# Patient Record
Sex: Female | Born: 1935 | Race: White | Hispanic: No | Marital: Married | Smoking: Former smoker
Health system: Southern US, Community
[De-identification: ages and names within clinical notes are randomized; demographics above are authoritative.]

## PROBLEM LIST (undated history)

## (undated) DIAGNOSIS — E785 Hyperlipidemia, unspecified: Secondary | ICD-10-CM

## (undated) HISTORY — DX: Hyperlipidemia, unspecified: E78.5

## (undated) HISTORY — PX: ABDOMINAL HYSTERECTOMY: SHX81

## (undated) HISTORY — PX: APPENDECTOMY: SHX54

## (undated) HISTORY — PX: FACIAL COSMETIC SURGERY: SHX629

---

## 1998-09-30 ENCOUNTER — Other Ambulatory Visit: Admission: RE | Admit: 1998-09-30 | Discharge: 1998-09-30 | Payer: Self-pay | Admitting: Obstetrics and Gynecology

## 1998-10-13 ENCOUNTER — Encounter: Admission: RE | Admit: 1998-10-13 | Discharge: 1999-01-11 | Payer: Self-pay | Admitting: Gynecology

## 1999-10-03 ENCOUNTER — Other Ambulatory Visit: Admission: RE | Admit: 1999-10-03 | Discharge: 1999-10-03 | Payer: Self-pay | Admitting: Obstetrics and Gynecology

## 1999-11-11 ENCOUNTER — Emergency Department (HOSPITAL_COMMUNITY): Admission: EM | Admit: 1999-11-11 | Discharge: 1999-11-11 | Payer: Self-pay | Admitting: Emergency Medicine

## 2006-07-17 ENCOUNTER — Encounter: Payer: Self-pay | Admitting: Internal Medicine

## 2007-03-24 ENCOUNTER — Emergency Department (HOSPITAL_COMMUNITY): Admission: EM | Admit: 2007-03-24 | Discharge: 2007-03-24 | Payer: Self-pay | Admitting: Emergency Medicine

## 2007-04-11 ENCOUNTER — Ambulatory Visit: Payer: Self-pay | Admitting: Cardiology

## 2007-04-17 ENCOUNTER — Ambulatory Visit: Payer: Self-pay | Admitting: Internal Medicine

## 2007-04-17 DIAGNOSIS — M899 Disorder of bone, unspecified: Secondary | ICD-10-CM | POA: Insufficient documentation

## 2007-04-17 DIAGNOSIS — T50995A Adverse effect of other drugs, medicaments and biological substances, initial encounter: Secondary | ICD-10-CM

## 2007-04-17 DIAGNOSIS — R030 Elevated blood-pressure reading, without diagnosis of hypertension: Secondary | ICD-10-CM

## 2007-04-17 DIAGNOSIS — M949 Disorder of cartilage, unspecified: Secondary | ICD-10-CM

## 2007-04-17 DIAGNOSIS — I499 Cardiac arrhythmia, unspecified: Secondary | ICD-10-CM | POA: Insufficient documentation

## 2007-04-17 DIAGNOSIS — M129 Arthropathy, unspecified: Secondary | ICD-10-CM | POA: Insufficient documentation

## 2007-04-17 DIAGNOSIS — M1990 Unspecified osteoarthritis, unspecified site: Secondary | ICD-10-CM | POA: Insufficient documentation

## 2007-04-17 DIAGNOSIS — E785 Hyperlipidemia, unspecified: Secondary | ICD-10-CM

## 2007-04-18 ENCOUNTER — Encounter: Payer: Self-pay | Admitting: Internal Medicine

## 2007-04-24 ENCOUNTER — Ambulatory Visit: Payer: Self-pay | Admitting: Cardiology

## 2007-04-24 LAB — CONVERTED CEMR LAB
ALT: 37 units/L — ABNORMAL HIGH (ref 0–35)
AST: 28 units/L (ref 0–37)
Albumin: 3.9 g/dL (ref 3.5–5.2)
BUN: 9 mg/dL (ref 6–23)
CO2: 30 meq/L (ref 19–32)
Chloride: 105 meq/L (ref 96–112)
Cholesterol: 238 mg/dL (ref 0–200)
Creatinine, Ser: 0.8 mg/dL (ref 0.4–1.2)
Direct LDL: 166.8 mg/dL
Total Protein: 7.3 g/dL (ref 6.0–8.3)

## 2007-04-25 ENCOUNTER — Encounter: Payer: Self-pay | Admitting: Cardiology

## 2007-04-25 ENCOUNTER — Encounter: Payer: Self-pay | Admitting: Internal Medicine

## 2007-04-25 ENCOUNTER — Ambulatory Visit: Payer: Self-pay

## 2007-04-30 ENCOUNTER — Ambulatory Visit: Payer: Self-pay | Admitting: Cardiology

## 2007-05-01 ENCOUNTER — Telehealth: Payer: Self-pay | Admitting: Internal Medicine

## 2007-05-03 ENCOUNTER — Encounter: Payer: Self-pay | Admitting: Internal Medicine

## 2007-05-07 ENCOUNTER — Telehealth: Payer: Self-pay | Admitting: *Deleted

## 2007-05-14 ENCOUNTER — Ambulatory Visit: Payer: Self-pay | Admitting: Internal Medicine

## 2007-05-21 ENCOUNTER — Ambulatory Visit: Payer: Self-pay | Admitting: Internal Medicine

## 2007-05-21 DIAGNOSIS — E559 Vitamin D deficiency, unspecified: Secondary | ICD-10-CM | POA: Insufficient documentation

## 2007-05-29 ENCOUNTER — Ambulatory Visit: Payer: Self-pay | Admitting: Internal Medicine

## 2007-05-29 ENCOUNTER — Telehealth: Payer: Self-pay | Admitting: *Deleted

## 2007-06-12 ENCOUNTER — Telehealth: Payer: Self-pay | Admitting: Internal Medicine

## 2007-06-26 ENCOUNTER — Encounter: Payer: Self-pay | Admitting: Internal Medicine

## 2007-06-27 ENCOUNTER — Ambulatory Visit: Payer: Self-pay | Admitting: Family Medicine

## 2007-06-27 DIAGNOSIS — F411 Generalized anxiety disorder: Secondary | ICD-10-CM | POA: Insufficient documentation

## 2007-06-27 DIAGNOSIS — N3000 Acute cystitis without hematuria: Secondary | ICD-10-CM | POA: Insufficient documentation

## 2007-06-27 LAB — CONVERTED CEMR LAB
Glucose, Urine, Semiquant: NEGATIVE
Nitrite: NEGATIVE
Protein, U semiquant: 30

## 2007-07-29 ENCOUNTER — Telehealth: Payer: Self-pay | Admitting: Internal Medicine

## 2007-08-05 ENCOUNTER — Telehealth: Payer: Self-pay | Admitting: Internal Medicine

## 2007-08-15 ENCOUNTER — Ambulatory Visit: Payer: Self-pay | Admitting: Internal Medicine

## 2007-08-15 LAB — CONVERTED CEMR LAB
Glucose, Urine, Semiquant: NEGATIVE
Protein, U semiquant: NEGATIVE
WBC Urine, dipstick: NEGATIVE
pH: 7

## 2007-08-16 ENCOUNTER — Encounter: Payer: Self-pay | Admitting: Internal Medicine

## 2007-08-21 ENCOUNTER — Telehealth: Payer: Self-pay | Admitting: *Deleted

## 2007-09-17 ENCOUNTER — Ambulatory Visit: Payer: Self-pay | Admitting: Internal Medicine

## 2007-09-17 DIAGNOSIS — R109 Unspecified abdominal pain: Secondary | ICD-10-CM

## 2007-09-17 LAB — CONVERTED CEMR LAB
BUN: 12 mg/dL (ref 6–23)
Basophils Relative: 0.8 % (ref 0.0–3.0)
Calcium: 9.4 mg/dL (ref 8.4–10.5)
Creatinine, Ser: 1 mg/dL (ref 0.4–1.2)
Eosinophils Relative: 1.3 % (ref 0.0–5.0)
GFR calc Af Amer: 70 mL/min
Glucose, Bld: 82 mg/dL (ref 70–99)
HCT: 40.4 % (ref 36.0–46.0)
Hemoglobin: 13.9 g/dL (ref 12.0–15.0)
Ketones, urine, test strip: NEGATIVE
Monocytes Absolute: 0.8 10*3/uL (ref 0.1–1.0)
Monocytes Relative: 7.9 % (ref 3.0–12.0)
Neutro Abs: 3.8 10*3/uL (ref 1.4–7.7)
Nitrite: NEGATIVE
RBC: 4.1 M/uL (ref 3.87–5.11)
RDW: 11.8 % (ref 11.5–14.6)
WBC: 9.8 10*3/uL (ref 4.5–10.5)
pH: 7

## 2007-09-18 ENCOUNTER — Encounter: Payer: Self-pay | Admitting: Internal Medicine

## 2007-09-18 ENCOUNTER — Ambulatory Visit: Payer: Self-pay | Admitting: Cardiovascular Disease

## 2007-10-22 ENCOUNTER — Encounter: Payer: Self-pay | Admitting: Internal Medicine

## 2007-10-23 ENCOUNTER — Telehealth: Payer: Self-pay | Admitting: Internal Medicine

## 2008-01-08 ENCOUNTER — Telehealth: Payer: Self-pay | Admitting: Internal Medicine

## 2008-01-13 ENCOUNTER — Encounter: Payer: Self-pay | Admitting: Internal Medicine

## 2008-09-21 ENCOUNTER — Encounter: Payer: Self-pay | Admitting: Internal Medicine

## 2008-09-30 ENCOUNTER — Encounter: Payer: Self-pay | Admitting: Internal Medicine

## 2010-06-07 NOTE — Assessment & Plan Note (Signed)
[facility]                            CARDIOLOGY OFFICE NOTE   NAME:Diana Landry, Diana Landry                         MRN:          [id number]  DATE:04/30/2007                            DOB:          1935-03-26    [name] returns today for followup.  Please see my extensive note  from our initial visit on April 11, 2007.   Her echocardiogram did not show any prolapse.  I did not think she had  this by exam, but was told by history she did.  Her potassium had been  low and we put her on a potassium rich diet and her potassium repeat was  3.9.  She had an exercise rest stress Myoview that showed poor physical  conditioning with an exercise time of 4 minutes.  Her heart rate  increased to 157 which is 104% of predicted maximum heart rate.  There  were no ST-segment changes.  There were no arrhythmias.  Her ejection  fraction is 76% with hyperdynamic LV function.  There was no sign of  ischemia.  Her TSH was normal.  Lipid panel showed a total cholesterol  of 238, LDL of 166.8, VLDL of 23, HDL 48.7, triglycerides of 117.  LFTs  were normal.   She cannot tolerate Crestor because of profound insomnia.  She is has  not trialed Pravachol.   She brought in a list of blood pressures which all are under good  control.  She has a history of labile hypertension.   ASSESSMENT/PLAN:  We spent about 20 plus minutes going over all of her  questions and answers.  I have recommend the following:   1. Pravastatin 20 mg p.o. nightly.  This certainly will not achieve      goal, but certainly will head things in the right direction.      Hopefully, will stabilize any nonobstructive plaque.  She agrees to      try this.  Will check liver and LFTs in 6 weeks.  2. Gradual increase in her exercise program for a target heart rate of      about 120-130 beats per minute.  3. Reassurance about her palpitations.  4. Follow blood pressure as she has done.  5. Reinforce the fact she  does not have prolapse.  6. I will plan on seeing her back in about 8 weeks.     Thomas C. Daleen Squibb, MD, [facility]  Electronically Signed    TCW/MedQ  DD: 04/30/2007  DT: 04/30/2007  Job #: [id number]   cc:   Neta Mends. Fabian Sharp, MD

## 2010-06-07 NOTE — Assessment & Plan Note (Signed)
[facility]                            CARDIOLOGY OFFICE NOTE   NAME:Diana Landry                         MRN:          [id number]  DATE:04/11/2007                            DOB:          Jul 19, 1935    HISTORY OF PRESENT ILLNESS:  I was asked by Dr. Dione Booze of the [facility] emergency department to follow-up and consult on [name]  with chest discomfort.   In the early morning hours of March 24, 2007, she awoke with chest  discomfort that she described as a heaviness 5/10.  It went around her  left side.  She denied any associated diaphoresis, dyspnea, shortness of  breath or nausea.   She has a history of hiatal hernia, but she said this was markedly  different.  She also has a history of mitral valve prolapse and a  history of palpitations and arrhythmias with her prolapse (premature  atrial contractions), diagnosed when she was 75 years old.  A 2-D  echocardiogram apparently done about 7 years ago at [facility] which showed  mild prolapse.   At the emergency room, her EKG was essentially normal except for some ST-  segment flattening inferolaterally.   Her chest x-ray showed a prominent fat pad, but no evidence of  cardiomegaly, edema or pulmonary disease.   Her point care markers were negative.  Her CBC was normal.  Her  electrolytes were normal except for a potassium of 3.4.  Her D-dimer was  normal.   Since discharge, she says her heart has been jumping around lot and she  feels it skipping and racing, particularly at night.   PAST MEDICAL HISTORY:  She does not smoke.  She quit in 1984.  She does  not use any recreational products.  She drinks a glass of wine or two a  week.  She does not use caffeinated beverages.  She does not use dietary  suppressants or over-the-counter decongestants.  She does have a history  of hyperlipidemia.  I do not have any values, but she says it has been  high in the past.  She enjoys yoga,  walking and goes up and down her  stairs which is three stories.   ALLERGIES:  SHE IS INTOLERANT TO PENICILLIN DETROL LA.  SHE HAS NO  HISTORY OF DYE REACTION.   MEDICATIONS:  She is currently on no medications.   SURGICAL HISTORY:  1. Appendectomy 1954.  2. Hysterectomy,.  3. Endometriosis in 1977.  4. Oophorectomy in 1982.  Both ovaries apparently have been removed.   FAMILY HISTORY:  Negative for premature coronary disease.   SOCIAL HISTORY:  She is married and has one child.  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   REVIEW OF SYSTEMS:  All systems tested.  She does have a hiatal hernia  and occasional reflux.  She usually takes Rolaids for this or TUMS.  She  has a history of menstrual dysfunction and chronic arthritis,  particularly in her neck.  She has some osteopenia.  Other review of  systems negative.   PHYSICAL EXAMINATION:  VITAL SIGNS:  Blood pressure 166/92 today.  Her  heart rate is 88 and regular.  She is 5 feet 4, she is emotional and her  weight is 169.  GENERAL:  She looks like she has not slept well in several days.  HEENT:  Normocephalic, atraumatic.  PERRL.  Extraocular is intact.  Sclerae are clear.  Facial symmetry is normal.  Carotid upstrokes equal  bilaterally without bruits, no JVD.  Thyroid is not enlarged.  NECK:  Supple.  Trachea is midline.  LUNGS:  Clear to auscultation and percussion without decreased breath  sounds or rhonchi or wheezes.  HEART:  Her PMI is nondisplaced.  I could hear no definite click.  She  has a very faint systolic murmur at the left sternal border.  There is  no gallop.  ABDOMEN:  Soft, good bowel sounds.  No midline bruit.  No tenderness.  No obvious organomegaly.  EXTREMITIES:  No cyanosis, clubbing or edema.  Pulses intact.  NEUROLOGICAL:  Exam is intact.  SKIN:  Unremarkable.   LABORATORY DATA:  I have reviewed blood pressures taken home and all are  quite good averaging about  125/70.  She did have a couple of systolics  that were high with heart rate of 120.  The majority are under good  control.   ASSESSMENT:  1. Chest discomfort with several cardiac risk factors.  Rule out      obstructive coronary disease.  2. History of palpitations and PACs for a number of years.  She      associates this with mitral valve prolapse.  If she has prolapse,      it is very mild by my exam.  3. Labile blood pressure because the association of tachycardia      disease is probably anxiety related.  However, we need to keep in      the back of our minds any sort of neuroendocrine problem.  I doubt      this is the case.  4. Hypokalemia which could make her palpitations and PACs worse.  We      talked about this at length.  There is no obvious calls of this.      Please note that her sodium was normal, however.   PLAN:  1. A 2-D echocardiogram assess for significant prolapse.  2. Potassium rich diet with repeat potassium next week.  3. Exercise/rest stress Myoview.  4. TSH, comprehensive metabolic panel and lipids.   After I obtain this date, I will sit down and talk to her.  We could  consider low dose beta blockade.  If she does not respond, just  reassurance.  She certainly has a potassium higher than low normal.     Thomas C. Daleen Squibb, MD, [facility]  Electronically Signed    TCW/MedQ  DD: 04/11/2007  DT: 04/12/2007  Job #: [id number]   cc:   Neta Mends. Fabian Sharp, MD  Dione Booze, MD

## 2010-09-30 ENCOUNTER — Inpatient Hospital Stay: Payer: Self-pay | Admitting: Internal Medicine

## 2010-10-17 LAB — DIFFERENTIAL
Basophils Absolute: 0.1
Basophils Relative: 1
Eosinophils Absolute: 0.2
Eosinophils Relative: 2
Lymphocytes Relative: 63 — ABNORMAL HIGH
Lymphs Abs: 7.2 — ABNORMAL HIGH
Monocytes Absolute: 0.9
Monocytes Relative: 8
Neutro Abs: 2.9
Neutrophils Relative %: 26 — ABNORMAL LOW

## 2010-10-17 LAB — POCT CARDIAC MARKERS
CKMB, poc: 1
Myoglobin, poc: 61
Operator id: 222501

## 2010-10-17 LAB — D-DIMER, QUANTITATIVE: D-Dimer, Quant: 0.29

## 2010-10-17 LAB — I-STAT 8, (EC8 V) (CONVERTED LAB)
Acid-Base Excess: 2
BUN: 13
Bicarbonate: 26.4 — ABNORMAL HIGH
Chloride: 104
Glucose, Bld: 114 — ABNORMAL HIGH
HCT: 44
Hemoglobin: 15
Operator id: 222501
Potassium: 3.4 — ABNORMAL LOW
Sodium: 137
TCO2: 28
pCO2, Ven: 38.6 — ABNORMAL LOW
pH, Ven: 7.443 — ABNORMAL HIGH

## 2010-10-17 LAB — CBC
MCHC: 34.1
RDW: 12.6

## 2010-10-17 LAB — PATHOLOGIST SMEAR REVIEW

## 2012-12-11 ENCOUNTER — Ambulatory Visit: Payer: Self-pay | Admitting: Podiatry

## 2014-03-03 ENCOUNTER — Ambulatory Visit: Payer: Self-pay | Admitting: Podiatry

## 2014-12-09 ENCOUNTER — Encounter (HOSPITAL_COMMUNITY): Payer: Self-pay | Admitting: *Deleted

## 2014-12-09 ENCOUNTER — Emergency Department (HOSPITAL_COMMUNITY)
Admission: EM | Admit: 2014-12-09 | Discharge: 2014-12-09 | Disposition: A | Discharge status: Home / Self Care | Payer: Medicare Other | Attending: Emergency Medicine | Admitting: Emergency Medicine

## 2014-12-09 ENCOUNTER — Emergency Department (HOSPITAL_COMMUNITY): Payer: Medicare Other

## 2014-12-09 DIAGNOSIS — R42 Dizziness and giddiness: Secondary | ICD-10-CM | POA: Diagnosis not present

## 2014-12-09 DIAGNOSIS — R200 Anesthesia of skin: Secondary | ICD-10-CM | POA: Diagnosis not present

## 2014-12-09 DIAGNOSIS — R000 Tachycardia, unspecified: Secondary | ICD-10-CM | POA: Diagnosis not present

## 2014-12-09 DIAGNOSIS — Z79899 Other long term (current) drug therapy: Secondary | ICD-10-CM | POA: Insufficient documentation

## 2014-12-09 LAB — COMPREHENSIVE METABOLIC PANEL
ALK PHOS: 89 U/L (ref 38–126)
ALT: 30 U/L (ref 14–54)
ANION GAP: 13 (ref 5–15)
AST: 29 U/L (ref 15–41)
Albumin: 4.1 g/dL (ref 3.5–5.0)
BILIRUBIN TOTAL: 0.6 mg/dL (ref 0.3–1.2)
BUN: 6 mg/dL (ref 6–20)
CALCIUM: 9.5 mg/dL (ref 8.9–10.3)
CO2: 22 mmol/L (ref 22–32)
Chloride: 102 mmol/L (ref 101–111)
Creatinine, Ser: 0.82 mg/dL (ref 0.44–1.00)
GFR calc non Af Amer: >60 mL/min (ref >60)
Glucose, Bld: 132 mg/dL — ABNORMAL HIGH (ref 65–99)
Potassium: 3.6 mmol/L (ref 3.5–5.1)
SODIUM: 137 mmol/L (ref 135–145)
TOTAL PROTEIN: 7.2 g/dL (ref 6.5–8.1)

## 2014-12-09 LAB — DIFFERENTIAL
Basophils Absolute: 0 10*3/uL (ref 0.0–0.1)
Basophils Relative: 0 %
EOS PCT: 1 %
Eosinophils Absolute: 0.1 10*3/uL (ref 0.0–0.7)
LYMPHS PCT: 55 %
Lymphs Abs: 4.9 10*3/uL — ABNORMAL HIGH (ref 0.7–4.0)
MONO ABS: 0.7 10*3/uL (ref 0.1–1.0)
Monocytes Relative: 7 %
Neutro Abs: 3.3 10*3/uL (ref 1.7–7.7)
Neutrophils Relative %: 37 %

## 2014-12-09 LAB — CBC
HEMATOCRIT: 45 % (ref 36.0–46.0)
HEMOGLOBIN: 14.9 g/dL (ref 12.0–15.0)
MCH: 32 pg (ref 26.0–34.0)
MCHC: 33.1 g/dL (ref 30.0–36.0)
MCV: 96.8 fL (ref 78.0–100.0)
Platelets: 275 10*3/uL (ref 150–400)
RBC: 4.65 MIL/uL (ref 3.87–5.11)
RDW: 13 % (ref 11.5–15.5)
WBC: 8.5 10*3/uL (ref 4.0–10.5)

## 2014-12-09 LAB — URINALYSIS, ROUTINE W REFLEX MICROSCOPIC
Bilirubin Urine: NEGATIVE
Glucose, UA: NEGATIVE mg/dL
Ketones, ur: 40 mg/dL — AB
NITRITE: NEGATIVE
PH: 8.5 — AB (ref 5.0–8.0)
Protein, ur: 30 mg/dL — AB
SPECIFIC GRAVITY, URINE: 1.015 (ref 1.005–1.030)

## 2014-12-09 LAB — RAPID URINE DRUG SCREEN, HOSP PERFORMED
Amphetamines: NOT DETECTED
Barbiturates: NOT DETECTED
Benzodiazepines: NOT DETECTED
Cocaine: NOT DETECTED
OPIATES: NOT DETECTED
Tetrahydrocannabinol: NOT DETECTED

## 2014-12-09 LAB — ETHANOL: Alcohol, Ethyl (B): <5 mg/dL (ref <5)

## 2014-12-09 LAB — URINE MICROSCOPIC-ADD ON: BACTERIA UA: NONE SEEN

## 2014-12-09 LAB — I-STAT TROPONIN, ED: Troponin i, poc: 0.01 ng/mL (ref 0.00–0.08)

## 2014-12-09 LAB — PROTIME-INR
INR: 0.96 (ref 0.00–1.49)
PROTHROMBIN TIME: 13 s (ref 11.6–15.2)

## 2014-12-09 LAB — APTT: APTT: 27 s (ref 24–37)

## 2014-12-09 LAB — CBG MONITORING, ED: Glucose-Capillary: 117 mg/dL — ABNORMAL HIGH (ref 65–99)

## 2014-12-09 NOTE — Discharge Instructions (Signed)
Paresthesia  Paresthesia is a burning or prickling feeling. This feeling can happen in any part of the body. It often happens in the hands, arms, legs, or feet. Usually, it is not painful. In most cases, the feeling goes away in a short time and is not a sign of a serious problem.  HOME CARE  · Avoid drinking alcohol.  · Try massage or needle therapy (acupuncture) to help with your problems.  · Keep all follow-up visits as told by your doctor. This is important.  GET HELP IF:  · You keep on having episodes of paresthesia.  · Your burning or prickling feeling gets worse when you walk.  · You have pain or cramps.  · You feel dizzy.  · You have a rash.  GET HELP RIGHT AWAY IF:  · You feel weak.  · You have trouble walking or moving.  · You have problems speaking, understanding, or seeing.  · You feel confused.  · You cannot control when you pee (urinate) or poop (bowel movement).  · You lose feeling (numbness) after an injury.  · You pass out (faint).     This information is not intended to replace advice given to you by your health care provider. Make sure you discuss any questions you have with your health care provider.     Document Released: 12/23/2007 Document Revised: 05/26/2014 Document Reviewed: 01/05/2014  Elsevier Interactive Patient Education ©2016 [employer]

## 2014-12-09 NOTE — ED Notes (Signed)
Per EMS- pt reports numbness in left cheek, arm and leg for 1 month mostly reported at night and upon waking in the morning and resolves throughout the day. . Pt states that she woke today with dizziness regardless of position. Pt is also noted to be hypertensive at 190 systolic.

## 2014-12-09 NOTE — ED Notes (Signed)
Checked patient blood sugar it was 117 notified RN Nikki of blood sugar

## 2014-12-09 NOTE — ED Provider Notes (Signed)
CSN: [id number]     Arrival date & time 12/09/14  [id number] History   First MD Initiated Contact with Patient 12/09/14 [phone]     Chief Complaint  Patient presents with  . Dizziness     (Consider location/radiation/quality/duration/timing/severity/associated sxs/prior Treatment) The history is provided by the patient.   Patient is a 79 year old with no significant past medical history presenting with acute onset dizziness since awakening this morning. Patient reports that she has had a several year history of numbness in her left lower extremity. This progressed to numbness in her left upper extremity and left side of her face over the past few months. Patient reports that she usually on experiences the numbness while laying down for bed. Last night, the numbness was the worst that it had ever been. The patient woke up and got something to drink and went back to bed. Upon awakening this morning, the numbness had improved but the patient reported that she was unable to stand or walk due to poor balance and dizziness. She described the dizziness as a sensation that she was about to pass out. Denies any room spinning symptoms. En route to the ED, EMS noted that the patient was tachycardic to the 140s and hypertensive to the 190s SBP. Patient has had occasional palpitations over the past several months and this morning had feelings like her heart was "fluttering." No chest pain or shortness of breath. No nausea or vomiting. No weakness anywhere. No issues with speaking or swallowing. No vision changes.   History reviewed. No pertinent past medical history. History reviewed. No pertinent past surgical history. No family history on file. Social History  Substance Use Topics  . Smoking status: Never Smoker   . Smokeless tobacco: None  . Alcohol Use: None   OB History    No data available     Review of Systems  Constitutional: Negative.  Negative for fever and chills.  HENT: Negative.  Negative for  trouble swallowing.   Eyes: Negative.   Respiratory: Negative.  Negative for shortness of breath.   Cardiovascular: Negative.  Negative for chest pain.  Gastrointestinal: Negative.  Negative for nausea and vomiting.  Endocrine: Negative.   Genitourinary: Negative.   Musculoskeletal: Negative.   Skin: Negative.   Neurological: Positive for dizziness and numbness. Negative for syncope, speech difficulty and weakness.  Hematological: Negative.   Psychiatric/Behavioral: Negative.       Allergies  Epinephrine; Other; Penicillins; Pravastatin sodium; Rosuvastatin; and Tolterodine tartrate  Home Medications   Prior to Admission medications   Medication Sig Start Date End Date Taking? Authorizing Provider  Ascorbic Acid (VITAMIN C) 1000 MG tablet Take 500 mg by mouth 3 (three) times daily.   Yes Historical Provider, MD  Cranberry ([employer] CRANBERRY) 500 MG CAPS Take 500 mg by mouth daily.   Yes Historical Provider, MD   BP 181/86 mmHg  Pulse 120  Temp(Src) 98 F (36.7 C) (Oral)  Resp 14  SpO2 100% Physical Exam  Constitutional: She is oriented to person, place, and time. She appears well-developed and well-nourished. No distress.  HENT:  Head: Normocephalic and atraumatic.  Eyes: EOM are normal. Pupils are equal, round, and reactive to light.  Neck: Normal range of motion.  Cardiovascular: Regular rhythm and normal heart sounds.  Tachycardia present.   Pulmonary/Chest: Effort normal and breath sounds normal. No respiratory distress. She has no wheezes.  Abdominal: Soft. Bowel sounds are normal. She exhibits no distension. There is no tenderness.  Musculoskeletal: Normal range  of motion.  Neurological: She is alert and oriented to person, place, and time. She has normal strength. No cranial nerve deficit or sensory deficit. Coordination normal.  Skin: Skin is warm and dry. No rash noted. No erythema.  Psychiatric: She has a normal mood and affect. Her behavior is normal.  Nursing note  and vitals reviewed.   ED Course  Procedures (including critical care time) Labs Review Labs Reviewed  URINALYSIS, ROUTINE W REFLEX MICROSCOPIC (NOT AT [facility]) - Abnormal; Notable for the following:    pH 8.5 (*)    Hgb urine dipstick TRACE (*)    Ketones, ur 40 (*)    Protein, ur 30 (*)    Leukocytes, UA TRACE (*)    All other components within normal limits  DIFFERENTIAL - Abnormal; Notable for the following:    Lymphs Abs 4.9 (*)    All other components within normal limits  COMPREHENSIVE METABOLIC PANEL - Abnormal; Notable for the following:    Glucose, Bld 132 (*)    All other components within normal limits  CBG MONITORING, ED - Abnormal; Notable for the following:    Glucose-Capillary 117 (*)    All other components within normal limits  CBC  PROTIME-INR  APTT  URINE RAPID DRUG SCREEN, HOSP PERFORMED  ETHANOL  URINE MICROSCOPIC-ADD ON  Rosezena Sensor, ED    Imaging Review Mr Brain Wo Contrast (neuro Protocol)  12/09/2014  CLINICAL DATA:  LEFT-sided numbness, symptoms for 1 month. Dizziness today. EXAM: MRI HEAD WITHOUT CONTRAST TECHNIQUE: Multiplanar, multiecho pulse sequences of the brain and surrounding structures were obtained without intravenous contrast. COMPARISON:  None. FINDINGS: Examination was prematurely truncated due to claustrophobia/panic attack. No evidence for acute infarction, hemorrhage, mass lesion, hydrocephalus, or extra-axial fluid. Generalized atrophy. Mild to moderate T2 and FLAIR hyperintensities representing small vessel disease. Flow voids are maintained throughout the carotid, basilar, and vertebral arteries. There are no areas of chronic hemorrhage. Chronic degenerative changes at the craniocervical junction, possible CPPD. IMPRESSION: Chronic changes as described.  No acute intracranial findings. Electronically Signed   By: Elsie Stain M.D.   On: 12/09/2014 11:46   I have personally reviewed and evaluated these images and lab results as part  of my medical decision-making.   EKG Interpretation   Date/Time:  Wednesday December 09 2014 09:01:41 EST Ventricular Rate:  119 PR Interval:  127 QRS Duration: 86 QT Interval:  329 QTC Calculation: 463 R Axis:   23 Text Interpretation:  probable Sinus tachycardia Atrial premature complex  Artifact in lead(s) I II III aVR aVL poor data quality Confirmed by KNAPP   MD-J, JON ([id number]) on 12/09/2014 9:10:18 AM      MDM   Final diagnoses:  Numbness  Dizziness   9:24 AM Patient is a 78 year old female with no significant past medical history presenting with acute onset dizziness, tachycardia, and hypertension in the setting of chronic history of left sided numbness. Physical exam notable for tachycardia and nonfocal neurological exam. Will obtain CBC, CMP, UA, ethanol, and UDS. Will obtain brain MRI to rule out CVA or intracranial mass.   12:56 PM Labs remarkable for mild hyperglycemia, otherwise unremarkable. Brain MRI negative for CVA or mass. Patient reports that her dizziness has resolved and she is now able to ambulate without issue. Unclear etiology for patient's left sided numbness and dizziness, however recommended follow up with PCP for evaluation for other causes of peripheral neuropathy including DM, thyroid disorders, and B12 deficiency. Patient's HR now improved to  low 100s. Encouraged patient to follow up with PCP for further investigation into palpitations with possible referral to cardiology for ambulatory monitoring if needed. Will discharge home.    Ardith Dark, MD 12/09/14 1318  Ardith Dark, MD 12/09/14 1319  Linwood Dibbles, MD 12/09/14 [phone]

## 2014-12-09 NOTE — ED Notes (Signed)
Pt states that dizziness has improved. Pt ambulated in hallway with no difficulty.

## 2014-12-15 ENCOUNTER — Other Ambulatory Visit: Payer: Self-pay | Admitting: Internal Medicine

## 2014-12-15 ENCOUNTER — Ambulatory Visit
Admission: RE | Admit: 2014-12-15 | Discharge: 2014-12-15 | Disposition: A | Discharge status: Home / Self Care | Payer: Medicare Other | Source: Ambulatory Visit | Attending: Internal Medicine | Admitting: Internal Medicine

## 2014-12-15 DIAGNOSIS — M542 Cervicalgia: Secondary | ICD-10-CM

## 2014-12-22 ENCOUNTER — Encounter: Payer: Self-pay | Admitting: Neurology

## 2014-12-22 ENCOUNTER — Ambulatory Visit (INDEPENDENT_AMBULATORY_CARE_PROVIDER_SITE_OTHER): Payer: Medicare Other | Admitting: Neurology

## 2014-12-22 VITALS — BP 170/84 | HR 98 | Ht 64.0 in | Wt 166.0 lb

## 2014-12-22 DIAGNOSIS — R202 Paresthesia of skin: Secondary | ICD-10-CM

## 2014-12-22 DIAGNOSIS — R002 Palpitations: Secondary | ICD-10-CM

## 2014-12-22 DIAGNOSIS — R200 Anesthesia of skin: Secondary | ICD-10-CM

## 2014-12-22 NOTE — Patient Instructions (Signed)
-   your symptoms not consistent with stroke and MRI brain was negative for stroke, do not think further stroke work up needed at this time.  - will do nerve conduction study - will refer to outpt PT/OT  - will check X-ray of lumbar spine - follow up with cardiology as scheduled and may consider cardiac monitoring.  - relax and less stress - follow up in 2 months

## 2014-12-22 NOTE — Progress Notes (Signed)
NEUROLOGY CLINIC NEW PATIENT NOTE  NAME: Diana Landry DOB: 12-05-1935 REFERRING PHYSICIAN: Panosh, Neta Mends, MD  I saw Gaston Islam as a new consult in the neurovascular clinic today regarding ED follow up.  HPI: Diana Landry is a 79 y.o. female with PMH of HLD, MVP, heart palpiatation (?PACs) and chest pain who presents as a new patient for numbness.   She stated that she has had a 10-year history of numbness in her left foot after a foot injury for which she had a fracture and developed arthritis in the left foot. Gradually the numbness one up to her left leg, especially with lower back stretching. She went to see a chiropractor, was told to have sciatica nerve pain, and received treatment and much improved. However, in recent years the numbness occurred intermittently to her left arm and left cheek, with numbness and tingling feelings, especially worse on lying flat at night, during the day with more activity and standing, the numbness and tingling will go away. For the last 2 months, the numbness seems getting worse.  On 12/08/14 at night, the numbness was the worst that it had ever been. The patient woke up and got something to drink and went back to bed. Upon awakening the second day, the numbness had improved but the patient reported that she was unable to stand or walk due to poor balance and dizziness. She described the dizziness as a sensation that she was about to pass out. Denies any room spinning symptoms. She has to hold on things while walking. She also felt heart palpitation and called EMS. EMS noted that the patient was tachycardic to the 140s and hypertensive to the 190s SBP. No chest pain or shortness of breath. No nausea or vomiting. No weakness anywhere. No issues with speaking or swallowing. No vision changes. She had MRI brian which was negative for stroke. EKG showed sinus tachy with PACs. Pt symptoms much better and she was discharged with outpt follow up.   She had hx of  MVP and palpitation and arrhythmias with PACs diagnosed when she was 79 years old. However, her follow up with cardiology Dr. Daleen Squibb in 2012 found no prolapse on 2D echo. She also has chest pain several times followed up with cardiology and a stress test at that time showed on exercise heart rateincreased to 157 which is 104% of predicted maximum heart rate. Therewere no ST-segment changes. There were no arrhythmias. Her EF was 76% with hyperdynamic LV function. There was no sign ofischemia. Her TSH was normal. Lipid panel showed a total cholesterolof 238, LDL of 166.8, VLDL of 23, HDL 48.7, triglycerides of 117. LFTswere normal. She cannot tolerate Crestor because of profound insomnia and she was put on pravastatin which she is also not taking currently.   During the interval time, she still has intermittent numbness feeling on the left side, worsening on lying flat, feels tired, and not sleeping well. She stated that she had a sleep change for the last 3-4 weeks with mild insomnia. She went to see PCP, checked A1c 5.9 TSH 2.8, MMA 141 and widened B-12 485. Had a cervical x-ray showed diffuse osteopenia and degenerative change but neural foramina are patent. She has appointment with cardiology next week. Her BP around 120-140 at home. Today in clinic 170/84 but pt admits anxious in the clinic.  She quit smoking in 1984, social drinking and no illicit drugs.  Past Medical History  Diagnosis Date  . Numbness    History reviewed.  No pertinent past surgical history. Family History  Problem Relation Age of Onset  . Stroke Mother    Current Outpatient Prescriptions  Medication Sig Dispense Refill  . Ascorbic Acid (VITAMIN C) 1000 MG tablet Take 500 mg by mouth 3 (three) times daily.    . Cranberry ([employer] CRANBERRY) 500 MG CAPS Take 500 mg by mouth daily.    . cyanocobalamin 500 MCG tablet Take 500 mcg by mouth daily.    Marland Kitchen. EPIPEN 2-PAK 0.3 MG/0.3ML SOAJ injection See admin instructions.  0    No current facility-administered medications for this visit.   Allergies  Allergen Reactions  . Penicillins Hives and Rash    Other Reaction: Not Assessed Has patient had a PCN reaction causing immediate rash, facial/tongue/throat swelling, SOB or lightheadedness with hypotension: Yesyes Has patient had a PCN reaction causing severe rash involving mucus membranes or skin necrosis: Nono Has patient had a PCN reaction that required hospitalization Nono Has patient had a PCN reaction occurring within the last 10 years: Nono If all of the above answers are "NO", then may proceed with Cephalosporin  . Epinephrine Other (See Comments)    Passed at dental office. Numbing agent  . Other     Nitrile gloves  . Pravastatin Sodium   . Pravastatin Sodium   . Rosuvastatin     REACTION: unable to conentrate andinsomnia with very low dose  . Tolterodine Tartrate   . Tolterodine Tartrate    Social History   Social History  . Marital Status: Married    Spouse Name: N/A  . Number of Children: N/A  . Years of Education: N/A   Occupational History  . Not on file.   Social History Main Topics  . Smoking status: Never Smoker   . Smokeless tobacco: Not on file  . Alcohol Use: 0.6 oz/week    1 Glasses of wine per week     Comment: occasionally  . Drug Use: Not on file  . Sexual Activity: Not on file   Other Topics Concern  . Not on file   Social History Narrative    Review of Systems Full 14 system review of systems performed and notable only for those listed, all others are neg:  Constitutional:   Cardiovascular:  Ear/Nose/Throat:   Skin:  Eyes:   Respiratory:   Gastroitestinal:   Genitourinary:  Hematology/Lymphatic:   Endocrine:  Musculoskeletal:   Allergy/Immunology:   Neurological:  Numbness Psychiatric: Decreased energy Sleep:    Physical Exam  Filed Vitals:   12/22/14 0941  BP: 170/84  Pulse: 98    General - Well nourished, well developed, in no apparent  distress.  Ophthalmologic - Sharp disc margins OU.  Cardiovascular - Regular rate and rhythm with no murmur.    Neck - supple, no nuchal rigidity .  Mental Status -  Level of arousal and orientation to time, place, and person were intact. Language including expression, naming, repetition, comprehension was assessed and found intact. Attention span and concentration were normal. Recent and remote memory were intact. Fund of Knowledge was assessed and was intact.  Cranial Nerves II - XII - II - Visual field intact OU. III, IV, VI - Extraocular movements intact. V - Facial sensation intact bilaterally. VII - Facial movement intact bilaterally. VIII - Hearing & vestibular intact bilaterally. X - Palate elevates symmetrically. XI - Chin turning & shoulder shrug intact bilaterally. XII - Tongue protrusion intact.  Motor Strength - The patient's strength was normal in all extremities and  pronator drift was absent.  Bulk was normal and fasciculations were absent.   Motor Tone - Muscle tone was assessed at the neck and appendages and was normal.  Reflexes - The patient's reflexes were normal in all extremities and she had no pathological reflexes.  Sensory - Light touch, temperature/pinprick and Romberg testing were assessed and were normal.    Coordination - The patient had normal movements in the hands and feet with no ataxia or dysmetria.  Tremor was absent.  Gait and Station - The patient's transfers, posture, gait, station, and turns were observed as normal.   Imaging  I have personally reviewed the radiological images below and agree with the radiology interpretations.  MRI brain 12/09/14 - no acute abnormalities. Generalized atrophy. Mild to moderate T2 and FLAIR hyperintensities representing small vessel disease.  Lab Review Cre 0.82    Assessment and Plan:   In summary, Chrystle Murillo is a 79 y.o. female with PMH of HLD, heart palpiatation (?PACs) and chest pain who  presents as a new patient for left sided numbness. Numbness is intermittent, involving initially left foot, then left leg, and recently left arm and cheek, worsening with lying flat, getting better and resolve during the day with activities. It seems left foot numbness resulted from previous trauma, left leg numbness likely due to lower back pain, however difficult to explain left arm and left cheek numbness without neck pain or central etiology. However, stroke is unlikely. We will do EMG/NCS, and referred to PT OT. However, anxiety seems to play a role in the symptomology. She has appointment with cardiology for palpitation and potentially cardiac monitoring.  - EMG/NCS - refer to outpt PT/OT  - X-ray of lumbar spine - follow up with cardiology as scheduled and may consider cardiac monitoring.  - relaxation and avoid stress - follow up in 2 months  Thank you very much for the opportunity to participate in the care of this patient.  Please do not hesitate to call if any questions or concerns arise.  Orders Placed This Encounter  Procedures  . DG Lumbar Spine 2-3 Views    Standing Status: Future     Number of Occurrences:      Standing Expiration Date: 02/22/2016    Order Specific Question:  Reason for Exam (SYMPTOM  OR DIAGNOSIS REQUIRED)    Answer:  lower back pain and left leg numbness    Order Specific Question:  Preferred imaging location?    Answer:  Internal  . Ambulatory referral to Physical Therapy    Referral Priority:  Routine    Referral Type:  Physical Medicine    Referral Reason:  Specialty Services Required    Requested Specialty:  Physical Therapy    Number of Visits Requested:  1  . Ambulatory referral to Occupational Therapy    Referral Priority:  Routine    Referral Type:  Occupational Therapy    Referral Reason:  Specialty Services Required    Requested Specialty:  Occupational Therapy    Number of Visits Requested:  1  . NCV with EMG(electromyography)    Standing  Status: Future     Number of Occurrences:      Standing Expiration Date: 12/23/2015    Order Specific Question:  Where should this test be performed?    Answer:  [facility]    Meds ordered this encounter  Medications  . EPIPEN 2-PAK 0.3 MG/0.3ML SOAJ injection    Sig: See admin instructions.    Refill:  0  .  cyanocobalamin 500 MCG tablet    Sig: Take 500 mcg by mouth daily.    Patient Instructions  - your symptoms not consistent with stroke and MRI brain was negative for stroke, do not think further stroke work up needed at this time.  - will do nerve conduction study - will refer to outpt PT/OT  - will check X-ray of lumbar spine - follow up with cardiology as scheduled and may consider cardiac monitoring.  - relax and less stress - follow up in 2 months    Marvel Plan, MD PhD [facility] [address], [address] [place], [place] [address] [phone]

## 2014-12-23 DIAGNOSIS — R202 Paresthesia of skin: Principal | ICD-10-CM

## 2014-12-23 DIAGNOSIS — R200 Anesthesia of skin: Secondary | ICD-10-CM | POA: Insufficient documentation

## 2014-12-23 DIAGNOSIS — R002 Palpitations: Secondary | ICD-10-CM | POA: Insufficient documentation

## 2014-12-28 NOTE — Progress Notes (Signed)
HPI: 79 year old female for evaluation of tachycardia. Echocardiogram April 2009 showed normal LV function. Nuclear study April 2009 showed ejection fraction 76% and normal perfusion. Seen in emergency room in November 2016 with dizziness. MRI showed no acute stroke. Patient noted to be tachycardic and cardiology asked to evaluate. Laboratories at the time of evaluation in November 2016 showed normal hemoglobin, negative troponin and normal potassium. Patient states that she has recently developed numbness in her left lower extremity. It has progressed to include her left upper extremity. It only occurs at night while sleeping and improves with ambulation. She denies dyspnea on exertion, orthopnea, PND, pedal edema, palpitations, syncope or chest pain. Note she was seen in the emergency room as described above and her heart rate was elevated and cardiology asked to evaluate. Also note she is seeing neurology for her numbness.  Current Outpatient Prescriptions  Medication Sig Dispense Refill  . Ascorbic Acid (VITAMIN C) 1000 MG tablet Take 500 mg by mouth 3 (three) times daily.    . Cranberry ([employer] CRANBERRY) 500 MG CAPS Take 500 mg by mouth daily.    . cyanocobalamin 500 MCG tablet Take 500 mcg by mouth daily.    Marland Kitchen EPIPEN 2-PAK 0.3 MG/0.3ML SOAJ injection See admin instructions.  0   No current facility-administered medications for this visit.    Allergies  Allergen Reactions  . Penicillins Hives and Rash  . Epinephrine Other (See Comments)    Passed at dental office. Numbing agent  . Other     Nitrile gloves  . Pravastatin Sodium   . Pravastatin Sodium   . Rosuvastatin     REACTION: unable to conentrate andinsomnia with very low dose  . Tolterodine Tartrate   . Tolterodine Tartrate      Past Medical History  Diagnosis Date  . Hyperlipidemia     Past Surgical History  Procedure Laterality Date  . Appendectomy    . Abdominal hysterectomy    . Facial cosmetic surgery       Social History   Social History  . Marital Status: Married    Spouse Name: N/A  . Number of Children: 1  . Years of Education: N/A   Occupational History  . Not on file.   Social History Main Topics  . Smoking status: Former Games developer  . Smokeless tobacco: Not on file  . Alcohol Use: 0.6 oz/week    1 Glasses of wine per week     Comment: occasionally  . Drug Use: No  . Sexual Activity: Not on file   Other Topics Concern  . Not on file   Social History Narrative    Family History  Problem Relation Age of Onset  . Stroke Mother   . CAD Brother     ROS: Numbness in her left lower and left upper extremity. no fevers or chills, productive cough, hemoptysis, dysphasia, odynophagia, melena, hematochezia, dysuria, hematuria, rash, seizure activity, orthopnea, PND, pedal edema, claudication. Remaining systems are negative.  Physical Exam:   Blood pressure 160/80, pulse 70, height  (1.626 m), weight 166 lb 3.2 oz (75.388 kg).  General:  Well developed/well nourished in NAD Skin warm/dry Patient not depressed No peripheral clubbing Back-normal HEENT-normal/normal eyelids Neck supple/normal carotid upstroke bilaterally; no bruits; no JVD; no thyromegaly chest - CTA/ normal expansion CV - RRR/normal S1 and S2; no murmurs, rubs or gallops;  PMI nondisplaced Abdomen -NT/ND, no HSM, no mass, + bowel sounds, no bruit 2+ femoral pulses, no bruits Ext-no edema,  chords, 2+ DP Neuro-grossly nonfocal  ECG 12/09/2014-sinus tachycardia, PACs, nonspecific ST changes.  Electrocardiogram today shows sinus rhythm at a rate of 100. Occasional PAC. No ST changes.

## 2014-12-29 ENCOUNTER — Encounter: Payer: Self-pay | Admitting: Cardiology

## 2014-12-29 ENCOUNTER — Ambulatory Visit (INDEPENDENT_AMBULATORY_CARE_PROVIDER_SITE_OTHER): Payer: Medicare Other | Admitting: Cardiology

## 2014-12-29 VITALS — BP 160/80 | HR 70 | Ht 64.0 in | Wt 166.2 lb

## 2014-12-29 DIAGNOSIS — R000 Tachycardia, unspecified: Secondary | ICD-10-CM

## 2014-12-29 DIAGNOSIS — R200 Anesthesia of skin: Secondary | ICD-10-CM

## 2014-12-29 DIAGNOSIS — R202 Paresthesia of skin: Secondary | ICD-10-CM | POA: Diagnosis not present

## 2014-12-29 DIAGNOSIS — R030 Elevated blood-pressure reading, without diagnosis of hypertension: Secondary | ICD-10-CM

## 2014-12-29 DIAGNOSIS — IMO0001 Reserved for inherently not codable concepts without codable children: Secondary | ICD-10-CM | POA: Insufficient documentation

## 2014-12-29 NOTE — Assessment & Plan Note (Signed)
Etiology unclear.Her symptoms are not consistent with vascular insufficiency. She has 2+ pulses and no claudication. I do not think further workup such as ABIs as indicated. She will follow-up with neurology for further evaluation.

## 2014-12-29 NOTE — Patient Instructions (Signed)
Your physician recommends that you schedule a follow-up appointment in: AS NEEDED  

## 2014-12-29 NOTE — Assessment & Plan Note (Signed)
Patient was noted to have a sinus tachycardia when in the emergency room. This is likely related to the stress of her ER evaluation. Her hemoglobin was normal. She states her primary care physician checked her thyroid/TSH and this was normal. I do not have records available. She does not have palpitations or other symptoms. I do not think further workup is required at this point. Note LV function was normal previously.

## 2014-12-29 NOTE — Assessment & Plan Note (Signed)
Patient's blood pressure is mildly elevated today. She will follow this and a medication can be added if needed in the future.

## 2014-12-31 ENCOUNTER — Ambulatory Visit: Payer: Medicare Other | Admitting: Occupational Therapy

## 2014-12-31 ENCOUNTER — Encounter: Payer: Self-pay | Admitting: Occupational Therapy

## 2014-12-31 ENCOUNTER — Ambulatory Visit: Payer: Medicare Other | Attending: Neurology | Admitting: Physical Therapy

## 2014-12-31 ENCOUNTER — Encounter: Payer: Self-pay | Admitting: Physical Therapy

## 2014-12-31 DIAGNOSIS — R200 Anesthesia of skin: Secondary | ICD-10-CM

## 2014-12-31 DIAGNOSIS — R201 Hypoesthesia of skin: Secondary | ICD-10-CM | POA: Diagnosis present

## 2014-12-31 DIAGNOSIS — R202 Paresthesia of skin: Secondary | ICD-10-CM | POA: Insufficient documentation

## 2014-12-31 NOTE — Therapy (Signed)
[facility] [address] [place], [place], [address] Phone: [phone]   Fax:  [phone]  Physical Therapy Evaluation  Patient Details  Name: Diana Landry MRN: [id number] Date of Birth: [birth year] Referring Provider: Marvel Plan, MD  Encounter Date: 12/31/2014      PT End of Session - 12/31/14 1400    Visit Number 1   Number of Visits 3   Authorization Type [employer]   PT Start Time 1530   PT Stop Time 1618   PT Time Calculation (min) 48 min   Activity Tolerance Patient tolerated treatment well   Behavior During Therapy [facility] for tasks assessed/performed      Past Medical History  Diagnosis Date  . Hyperlipidemia     Past Surgical History  Procedure Laterality Date  . Appendectomy    . Abdominal hysterectomy    . Facial cosmetic surgery      There were no vitals filed for this visit.  Visit Diagnosis:  Numbness and tingling of left leg  Numbness and tingling in left arm      Subjective Assessment - 12/31/14 1535    Subjective This 79yo female has numbness in left leg numbness over last several years. A couple of months ago she noticed numbness in left arm & cheek. She reports numbness wakes her at night and has to walk for ~an hour before she can go back to sleep. She saw a neurologist who referred to PT.    Patient Stated Goals she would like to get rid of numbness   Currently in Pain? Yes   Pain Score 5   best 0/10, worst 10/10 at night   Pain Location Leg   Pain Orientation Left   Pain Descriptors / Indicators Numbness;Tingling   Pain Type Chronic pain   Pain Onset More than a month ago   Pain Frequency Intermittent   Aggravating Factors  sleeping   Pain Relieving Factors walking   Effect of Pain on Daily Activities cannot sleep thru the night   Multiple Pain Sites No            [facility] PT Assessment - 12/31/14 1530    Assessment   Medical Diagnosis Numbness   Referring Provider  Marvel Plan, MD   Precautions   Precautions None   Restrictions   Weight Bearing Restrictions No   Balance Screen   Has the patient fallen in the past 6 months No   Has the patient had a decrease in activity level because of a fear of falling?  No   Is the patient reluctant to leave their home because of a fear of falling?  No   Home [employer]ist information centre manager residence   Living Arrangements Spouse/significant other   Type of Home --  Townhouse   Home Access Stairs to enter   Entrance Stairs-Number of Steps 1   Entrance Stairs-Rails None   Home Layout One level   Prior Function   Level of Independence Independent   Vocation Retired   Observation/Other Assessments   Focus on Therapeutic Outcomes (FOTO)  83 Functional Status  49 Risk Adjusted   Sensation   Light Touch Appears Intact   Proprioception Appears Intact   Functional Tests   Functional tests Single leg stance   Single Leg Stance   Comments right 3 secs and left 5 secs   Posture/Postural Control   Posture/Postural Control Postural limitations   Postural Limitations Rounded Shoulders;Forward head   Tone  Assessment Location Right Lower Extremity;Left Lower Extremity   ROM / Strength   AROM / PROM / Strength AROM;Strength   AROM   Overall AROM  Within functional limits for tasks performed   Strength   Overall Strength Within functional limits for tasks performed   Transfers   Transfers Sit to Stand;Stand to Sit   Sit to Stand 7: Independent;Without upper extremity assist;From bed   Stand to Sit 7: Independent;Without upper extremity assist;To bed   Ambulation/Gait   Ambulation/Gait Yes   Ambulation/Gait Assistance 7: Independent   Assistive device None   Gait Pattern Within Functional Limits   Ambulation Surface Indoor;Level   RLE Tone   RLE Tone Within Functional Limits   LLE Tone   LLE Tone Within Functional Limits     Sitting tolerance: Patient sat in lobby chair for 20 minutes prior to  PT appt causing entire left LE numbness and tingling 5/10. Supine: Patient positioned in supine for 3 minutes and patient reports numbness in left LE worsened 6/10. Right sidelying: Patient only uses 2 pillows under head causing lateral cervical flexion, left UE & LE rotate forward seeking support. Left sidelying: Patient only uses 2 pillows under head causing lateral cervical flexion, right UE & LE rotate forward seeking support. Patient reports heavy pressure on left shoulder & hip causing discomfort and increased numbness & tingling to 7/10 with only 3 minutes of position.                        PT Education - 12/31/14 1620    Education provided Yes   Education Details positioning with pillows in sidelying and uses of pillows to tent sheets off feet, carpal tunnel brace may assist with UE night positioning   Person(s) Educated Patient   Methods Explanation;Demonstration   Comprehension Verbalized understanding;Verbal cues required             PT Long Term Goals - 12/31/14 1620    PT LONG TERM GOAL #1   Title Patient verbalizes and demonstrates understanding of positioning for bed to decrease numbness awaken her. (Target Date: 01/19/2015)   Time 2   Period Weeks   Status New   PT LONG TERM GOAL #2   Title Patient verbalizes and demonstrates proper techniques for stretching program. (Target Date: 01/19/2015)   Time 2   Period Weeks   Status New               Plan - 12/31/14 1400    Clinical Impression Statement This 79yo female appears to be having increased issues with left lower & upper extremity numbness & tingling limiting sleeping and sitting tolerance. Patient appears to have normal AROM for cervical & lumbar regions.Patient appears would benefit from education on positioning for bed mobility. She also may benefit from assessment of her stretching program.     Pt will benefit from skilled therapeutic intervention in order to improve on the following  deficits Pain;Other (comment)  impaired sleeping & sitting tolerance   Rehab Potential Good   PT Frequency 1x / week   PT Duration 2 weeks  After evaluation   PT Treatment/Interventions ADLs/Self Care Home Management;Therapeutic exercise;Therapeutic activities;Patient/family education   PT Next Visit Plan assess if postioning instructed today improved sleep; assess her stretching program   Consulted and Agree with Plan of Care Patient          G-Codes - 12/31/14 1400    Functional Assessment Tool Used numbness &  tingling to level 10/10 awakens patient for ~1 hour during the night   Functional Limitation Self care   Self Care Current Status [phone]) At least 60 percent but less than 80 percent impaired, limited or restricted   Self Care Goal Status ([id number]) At least 40 percent but less than 60 percent impaired, limited or restricted       Problem List Patient Active Problem List   Diagnosis Date Noted  . Tachycardia 12/29/2014  . Elevated blood pressure 12/29/2014  . Numbness and tingling of left arm and leg 12/23/2014  . Palpitations 12/23/2014  . FLANK PAIN, LEFT 09/17/2007  . ANXIETY 06/27/2007  . ACUTE CYSTITIS 06/27/2007  . UNSPECIFIED VITAMIN D DEFICIENCY 05/21/2007  . HYPERLIPIDEMIA 04/17/2007  . CARDIAC ARRHYTHMIA 04/17/2007  . OSTEOARTHRITIS 04/17/2007  . ARTHRITIS 04/17/2007  . OSTEOPENIA 04/17/2007  . INCREASED BLOOD PRESSURE 04/17/2007  . UNS ADVRS EFF OTH RX MEDICINAL&BIOLOGICAL SBSTNC 04/17/2007    Carlosdaniel Grob PT, DPT 12/31/2014, 11:24 PM  [facility] [address] [place], [place], [address] Phone: [phone]   Fax:  [phone]  Name: Diana Landry MRN: [id number] Date of Birth: Mar 19, 1935

## 2014-12-31 NOTE — Therapy (Signed)
[facility] [address] [place], [place], [address] Phone: [phone]   Fax:  [phone]  Occupational Therapy Evaluation  Patient Details  Name: Diana Landry MRN: [id number] Date of Birth: Jul 15, 1935 Referring Provider: Dr. Marvel PlanJindong Xu  Encounter Date: 12/31/2014      OT End of Session - 12/31/14 1535    Visit Number 1   Number of Visits 1   Authorization Type [employer] medicare    OT Start Time 1445   OT Stop Time 1515   OT Time Calculation (min) 30 min   Activity Tolerance Patient tolerated treatment well      Past Medical History  Diagnosis Date  . Hyperlipidemia     Past Surgical History  Procedure Laterality Date  . Appendectomy    . Abdominal hysterectomy    . Facial cosmetic surgery      There were no vitals filed for this visit.  Visit Diagnosis:  Impaired sensation      Subjective Assessment - 12/31/14 1452    Subjective  I function fine during the day - the problem is at night when I sleep.  I  get numb when I sleep.    Pertinent History Pt with LLE and LUE numbness when she sleeps. PT IS ALLEGIC TO BLUE GLOVES.   Patient Stated Goals I want the numbness when I sleep to stop           [facility] Assessment - 12/31/14 0001    Assessment   Diagnosis LLE and LUE numbness when pt sleeps   Referring Provider Dr. Marvel PlanJindong Xu   Onset Date --  10/2014   Prior Therapy none   Precautions   Precautions None   Restrictions   Weight Bearing Restrictions No   Balance Screen   Has the patient fallen in the past 6 months No  Pt receiving PT eval today   Home  Environment   Family/patient expects to be discharged to: Private residence   Living Arrangements Spouse/significant other   Available Help at Discharge Available PRN/intermittently   Type of Home --  Town house   Home Access Stairs  1 step onto porch   Home Layout One level   Bathroom Copywriter, advertisinghower/Tub Walk-in Shower   Additional Comments  Grab bar in shower and one outside the shower   Prior Function   Level of Independence Independent   Vocation Retired   ADL   Eating/Feeding Independent   Grooming Independent   ComptrollerUpper Body Bathing Independent   Lower Body Bathing Independent   Upper Body Dressing Independent   Lower Body Dressing Independent   Toilet Tranfer Independent   Toileting - Conservator, museum/galleryClothing Manipulation Independent   Toileting -  Tour managerHygiene Independent   Tub/Shower Transfer Independent   IADL   Shopping Takes care of all shopping needs independently   Light Housekeeping Maintains house alone or with occasional assistance   Meal Prep Plans, prepares and serves adequate meals independently   Training and development officerCommunity Mobility Drives own vehicle   Medication Management Is responsible for taking medication in correct dosages at correct time   Development worker, communityinancial Management Manages financial matters independently (budgets, writes checks, pays rent, bills goes to bank), collects and keeps track of income   Mobility   Mobility Status Independent   Written Expression   Dominant Hand Right   Vision - History   Baseline Vision Wears glasses only for reading   Vision Assessment   Comment Pt states no issues with her vision   Activity  Tolerance   Activity Tolerance Endurance does not limit participation in activity   Cognition   Overall Cognitive Status Within Functional Limits for tasks assessed   Sensation   Light Touch Appears Intact   Hot/Cold Appears Intact   Proprioception Appears Intact   Coordination   Gross Motor Movements are Fluid and Coordinated Yes   Fine Motor Movements are Fluid and Coordinated Yes   9 Hole Peg Test Right;Left   Right 9 Hole Peg Test 23.66   Left 9 Hole Peg Test 18.73   Other Pt with no ROM or fine motor deficits. Pt reports she has no difficulty using her arms.   Tone   Assessment Location Left Upper Extremity   ROM / Strength   AROM / PROM / Strength AROM;Strength   AROM   Overall AROM  Within functional  limits for tasks performed   Strength   Overall Strength Within functional limits for tasks performed   Hand Function   Right Hand Gross Grasp Functional   Right Hand Grip (lbs) 40   Left Hand Gross Grasp Functional   Left Hand Grip (lbs) 40   LUE Tone   LUE Tone Within Functional Limits                           OT Short Term Goals - 2015-01-03 1528    OT SHORT TERM GOAL #1   Title n/a           OT Long Term Goals - 01/03/15 1528    OT LONG TERM GOAL #1   Title n/a               Plan - Jan 03, 2015 1529    Clinical Impression Statement Pt is a 79 year old female who presents today with the complaint that she experiences numbness in LLE and LLE when she sleeps. Pt reports it started 10 years ago in her L foot and then progressed to her leg.  About 2 months ago it progressed to her her LUE and her face. Pt states she only experiences this at night when she sleeps.  States it is there when she wakes in the morning however get better as she moves around.  Pt has been seen by neruology and referred to determine if any other deficits are present.  Pt does not exhibit any other deficits (see eval for details) and reports she has no difficulty doing any activity in her day, both in her home or in the community.  At this time, pt does not demonstrate a need for skilled OT. Pt educated and able to verbalize understanding. Pt in agreement.    Pt will benefit from skilled therapeutic intervention in order to improve on the following deficits (Retired) --  n/a   Plan no further OT services indicated at this time.    Consulted and Agree with Plan of Care Patient          G-Codes - 01-03-2015 1539    Functional Limitation Carrying, moving and handling objects   Carrying, Moving and Handling Objects Current Status [id number] 0 percent impaired, limited or restricted   Carrying, Moving and Handling Objects Goal Status (U0454) 0 percent impaired, limited or restricted    Carrying, Moving and Handling Objects Discharge Status [phone] 0 percent impaired, limited or restricted      Problem List Patient Active Problem List   Diagnosis Date Noted  . Tachycardia 12/29/2014  . Elevated blood pressure  12/29/2014  . Numbness and tingling of left arm and leg 12/23/2014  . Palpitations 12/23/2014  . FLANK PAIN, LEFT 09/17/2007  . ANXIETY 06/27/2007  . ACUTE CYSTITIS 06/27/2007  . UNSPECIFIED VITAMIN D DEFICIENCY 05/21/2007  . HYPERLIPIDEMIA 04/17/2007  . CARDIAC ARRHYTHMIA 04/17/2007  . OSTEOARTHRITIS 04/17/2007  . ARTHRITIS 04/17/2007  . OSTEOPENIA 04/17/2007  . INCREASED BLOOD PRESSURE 04/17/2007  . UNS ADVRS EFF OTH RX MEDICINAL&BIOLOGICAL SBSTNC 04/17/2007    Norton Pastel, OTR/L 12/31/2014, 3:39 PM  [facility] [address] [place], [place], [address] Phone: [phone]   Fax:  [phone]  Name: Diana Landry MRN: [id number] Date of Birth: 1935/08/28

## 2015-01-04 ENCOUNTER — Ambulatory Visit
Admission: RE | Admit: 2015-01-04 | Discharge: 2015-01-04 | Disposition: A | Discharge status: Home / Self Care | Payer: Medicare Other | Source: Ambulatory Visit | Attending: Internal Medicine | Admitting: Internal Medicine

## 2015-01-04 ENCOUNTER — Other Ambulatory Visit: Payer: Self-pay | Admitting: Internal Medicine

## 2015-01-04 DIAGNOSIS — M5432 Sciatica, left side: Secondary | ICD-10-CM

## 2015-01-06 ENCOUNTER — Telehealth: Payer: Self-pay | Admitting: Neurology

## 2015-01-06 NOTE — Telephone Encounter (Signed)
OK. Let's cancel that. Thanks.  Marvel PlanJindong Glema Takaki, MD PhD Stroke Neurology 01/06/2015 5:35 PM

## 2015-01-06 NOTE — Telephone Encounter (Signed)
Called pt to schedule NCV/EMG and pt stated that after speaking to friends who'd had the procedure and then researching it online, she feels it isn't necessary.  She stated that even if something is found to be wrong they can't fix it.  I assured her there are treatment options depending on the findings and that the test is a very informative one, however, we respect her wishes.

## 2015-01-07 ENCOUNTER — Telehealth: Payer: Self-pay | Admitting: Neurology

## 2015-01-07 ENCOUNTER — Other Ambulatory Visit: Payer: Self-pay

## 2015-01-07 DIAGNOSIS — R200 Anesthesia of skin: Secondary | ICD-10-CM

## 2015-01-07 DIAGNOSIS — R202 Paresthesia of skin: Principal | ICD-10-CM

## 2015-01-07 NOTE — Telephone Encounter (Signed)
Diana Landry/Dr. Trilby DrummerSujatha Raman, [place] [phone] called to advise patient was seen recently, they ordered xray, xray shows degenerative disc disease, recommends PT, [facility]

## 2015-01-07 NOTE — Telephone Encounter (Signed)
NCV and EMG cancel per Dr.Xu request. Pt refuse test per phone note.

## 2015-01-08 NOTE — Telephone Encounter (Signed)
PT ordered. Thanks  Marvel PlanJindong Elion Hocker, MD PhD Stroke Neurology 01/08/2015 8:08 AM  Orders Placed This Encounter  Procedures  . Ambulatory referral to Physical Therapy    Referral Priority:  Routine    Referral Type:  Physical Medicine    Referral Reason:  Specialty Services Required    Requested Specialty:  Physical Therapy    Number of Visits Requested:  1

## 2015-01-08 NOTE — Telephone Encounter (Signed)
Referral sent via epic to Neuro rehab next door.

## 2015-01-12 ENCOUNTER — Ambulatory Visit: Payer: Medicare Other | Admitting: Physical Therapy

## 2015-01-19 ENCOUNTER — Ambulatory Visit: Payer: Medicare Other | Admitting: Physical Therapy

## 2015-02-16 ENCOUNTER — Encounter: Payer: Self-pay | Admitting: Physical Therapy

## 2015-02-16 NOTE — Therapy (Signed)
[facility] [address] [place], [place], [address] Phone: [phone]   Fax:  [phone]  Patient Details  Name: Diana Landry MRN: [id number] Date of Birth: [birth year] Referring Provider:  Marvel Plan, MD  Encounter Date: 02/16/2015  This patient was evaluated on 12/31/2014 for physical therapy. She did not return for any scheduled appointments.  PT is discharging due to not returning.   Vladimir Faster PT, DPT 02/16/2015, 8:56 AM  [facility] [address] [place], [place], [address] Phone: [phone]   Fax:  [phone]

## 2015-02-18 ENCOUNTER — Ambulatory Visit: Payer: Medicare Other | Admitting: Neurology

## 2015-03-04 ENCOUNTER — Ambulatory Visit: Payer: Medicare Other | Admitting: Neurology

## 2015-07-26 ENCOUNTER — Ambulatory Visit
Admission: RE | Admit: 2015-07-26 | Discharge: 2015-07-26 | Disposition: A | Discharge status: Home / Self Care | Payer: Medicare Other | Source: Ambulatory Visit | Attending: Family Medicine | Admitting: Family Medicine

## 2015-07-26 ENCOUNTER — Other Ambulatory Visit: Payer: Self-pay | Admitting: Family Medicine

## 2015-07-26 DIAGNOSIS — J189 Pneumonia, unspecified organism: Secondary | ICD-10-CM

## 2015-08-25 ENCOUNTER — Encounter: Payer: Self-pay | Admitting: Podiatry

## 2015-08-25 ENCOUNTER — Ambulatory Visit (INDEPENDENT_AMBULATORY_CARE_PROVIDER_SITE_OTHER): Payer: Medicare Other | Admitting: Podiatry

## 2015-08-25 VITALS — BP 153/78 | HR 85 | Resp 16 | Ht 64.0 in | Wt 164.0 lb

## 2015-08-25 DIAGNOSIS — L03031 Cellulitis of right toe: Secondary | ICD-10-CM | POA: Diagnosis not present

## 2015-08-25 DIAGNOSIS — M79671 Pain in right foot: Secondary | ICD-10-CM | POA: Diagnosis not present

## 2015-08-25 NOTE — Progress Notes (Signed)
   Subjective:    Patient ID: Diana Landry, female    DOB: Feb 22, 1935, 80 y.o.   MRN: [id number]  HPI Chief Complaint  Patient presents with  . Nail Problem    Right foot; great toe-lateral side; pt stated, "nail is sharp and stabs foot; allergic to nitrile gloves"; x2 months      Review of Systems  All other systems reviewed and are negative.      Objective:   Physical Exam        Assessment & Plan:

## 2015-08-26 ENCOUNTER — Telehealth: Payer: Self-pay | Admitting: *Deleted

## 2015-08-26 NOTE — Telephone Encounter (Signed)
Pt states she reviewed her After Visit Summary and there are corrections to be made. Pt states she needs to have NITRILE put in her allergies. I reviewed pt's allergy list and NITRILE is listed under OTHER and with the reaction.  I informed pt and reminded pt to always remind medical personnel.

## 2015-08-26 NOTE — Progress Notes (Signed)
Subjective:     Patient ID: Diana Landry, female   DOB: [birth year], 80 y.o.   MRN: [id number]  HPI patient presents stating that she's developed an ingrown toenail on the right big toe on the lateral side towards the second toe that makes it hard for her to wear shoe gear comfortably   Review of Systems  All other systems reviewed and are negative.      Objective:   Physical Exam  Constitutional: She is oriented to person, place, and time.  Cardiovascular: Intact distal pulses.   Musculoskeletal: Normal range of motion.  Neurological: She is oriented to person, place, and time.  Skin: Skin is warm.  Nursing note and vitals reviewed.  neurovascular status found to be intact muscle strength adequate range of motion within normal limits with patient found to have incurvated lateral border right hallux that's very painful when pressed. Patient has distal redness but no drainage noted and I'll a G noted. Patient has good digital perfusion is well oriented 3     Assessment:     Ingrown toenail deformity right hallux lateral border    Plan:     H&P and condition reviewed. I explained risk of ingrown toenail and procedure and patient wants procedure and today I infiltrated the right hallux 60 mg Xylocaine Marcaine mixture and remove the lateral border exposing matrix and applied phenol 3 applications 30 seconds followed by alcohol lavage and sterile dressing. Gave instructions on soaks and reappoint

## 2015-09-07 ENCOUNTER — Telehealth: Payer: Self-pay | Admitting: *Deleted

## 2015-09-07 NOTE — Telephone Encounter (Signed)
Left message for patient at [phone] (Home #) to check to see how they were doing from their Paronychia procedure that was performed on Wednesday, August 25, 2015. Waiting for a response.

## 2015-10-25 ENCOUNTER — Ambulatory Visit (INDEPENDENT_AMBULATORY_CARE_PROVIDER_SITE_OTHER): Payer: Medicare Other | Admitting: Podiatry

## 2015-10-25 DIAGNOSIS — B351 Tinea unguium: Secondary | ICD-10-CM

## 2015-10-25 DIAGNOSIS — L03039 Cellulitis of unspecified toe: Secondary | ICD-10-CM | POA: Diagnosis not present

## 2015-10-25 DIAGNOSIS — M79676 Pain in unspecified toe(s): Secondary | ICD-10-CM

## 2015-10-25 DIAGNOSIS — L03031 Cellulitis of right toe: Secondary | ICD-10-CM

## 2015-10-25 DIAGNOSIS — M79671 Pain in right foot: Secondary | ICD-10-CM

## 2015-10-25 DIAGNOSIS — L600 Ingrowing nail: Secondary | ICD-10-CM

## 2015-10-25 NOTE — Progress Notes (Signed)
SUBJECTIVE Patient  presents to office today complaining of elongated, thickened nail to the lateral aspect of the right great toe. Pain while ambulating in shoes. Patient is unable to trim their own nails.  [facility] and she wants her toenail evaluated. She does not want a partial nail avulsion at this time.  OBJECTIVE General Patient is awake, alert, and oriented x 3 and in no acute distress. Derm Skin is dry and supple bilateral. Negative open lesions or macerations. Remaining integument unremarkable. Right hallux nail is tender, long, thickened and dystrophic with subungual debris, consistent with onychomycosis. No signs of infection noted. Vasc  DP and PT pedal pulses palpable bilaterally. Temperature gradient within normal limits.  Neuro Epicritic and protective threshold sensation diminished bilaterally.  Musculoskeletal Exam No symptomatic pedal deformities noted bilateral. Muscular strength within normal limits.  ASSESSMENT 1. Onychodystrophic nail noted to the lateral border of the right hallux  2. Onychomycosis of nail due to dermatophyte bilateral 3. Pain in right great toe  PLAN OF CARE 1. Patient evaluated today.  2. Instructed to maintain good pedal hygiene and foot care.  3. Mechanical debridement of lateral border of the right great toe nail performed using a nail nipper. Filed with dremel without incident.  4. Return to clinic in 3 mos.    Felecia ShellingBrent M Tion Tse, DPM

## 2016-07-10 IMAGING — CR DG CHEST 2V
2 series · 2 of 2 positions shown · non-contrast
Comparison: 03/24/2007

CLINICAL DATA: History of pneumonia, followup examination

EXAM:
CHEST  2 VIEW

[w chest pa]
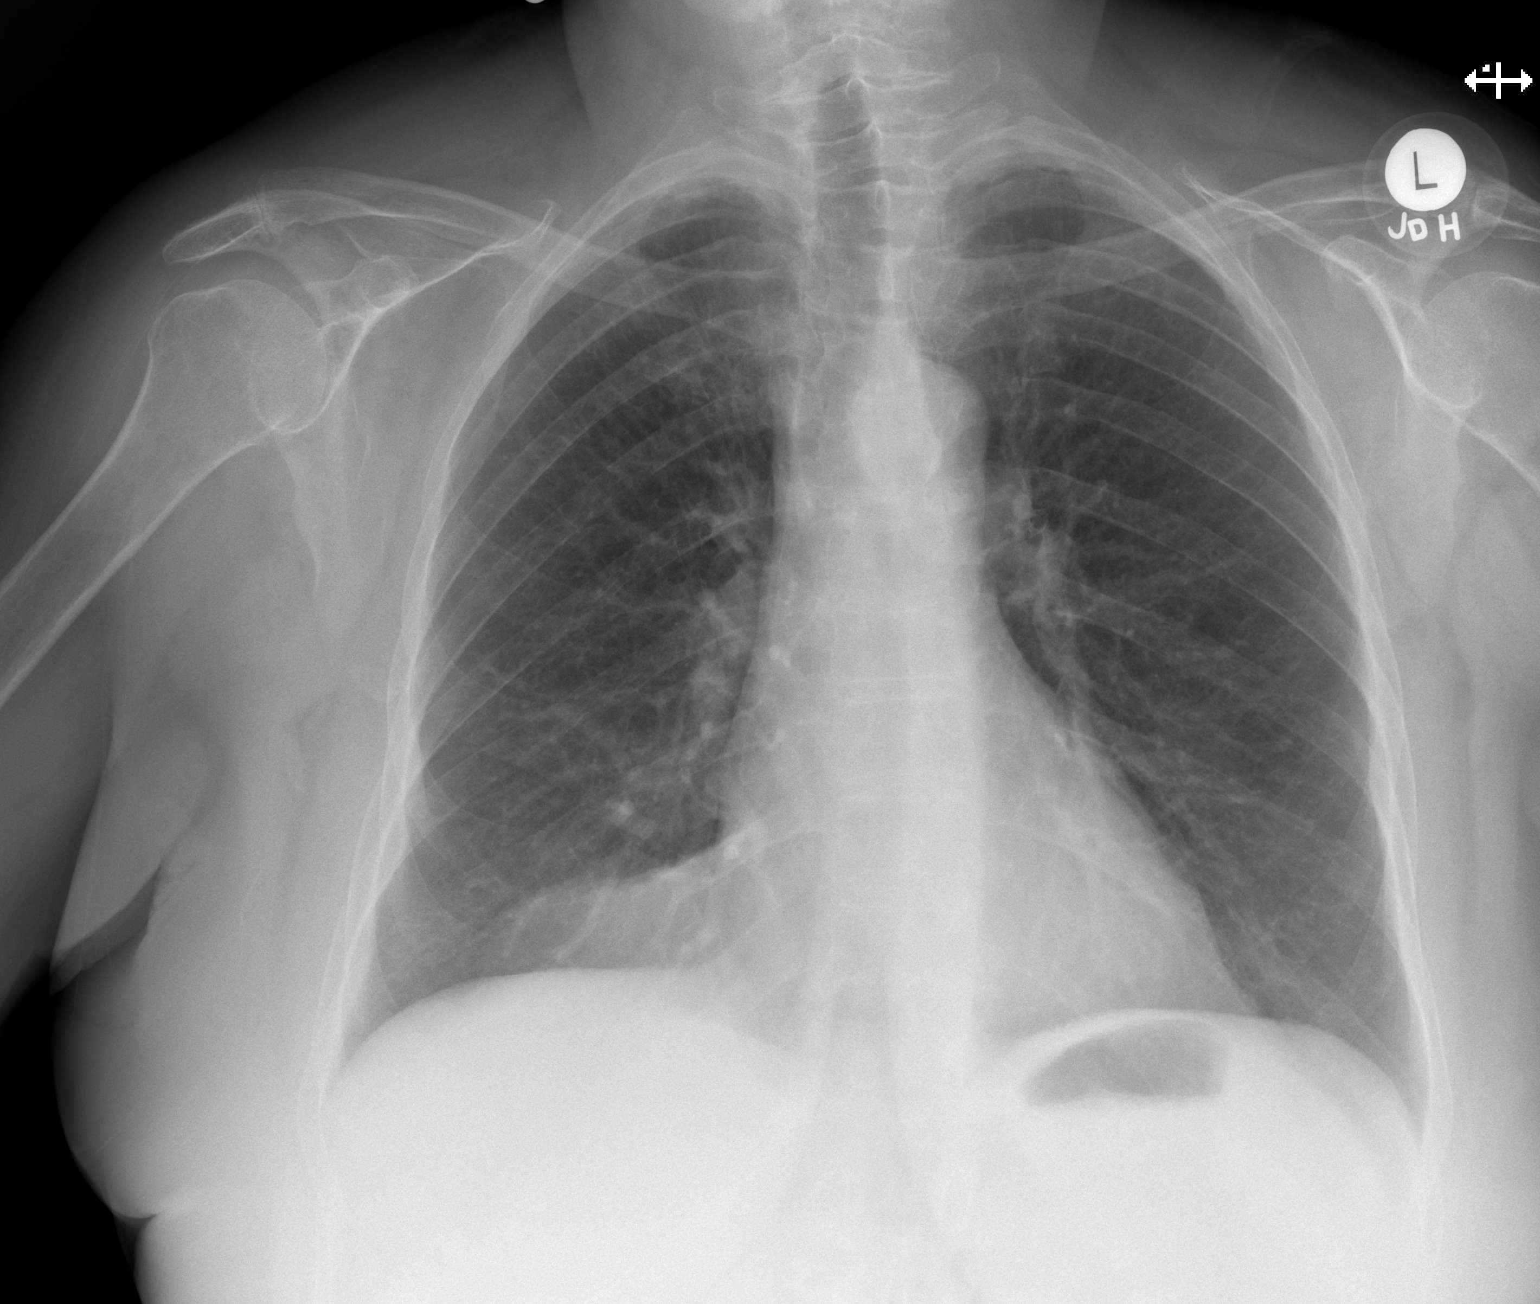

[w chest lat]
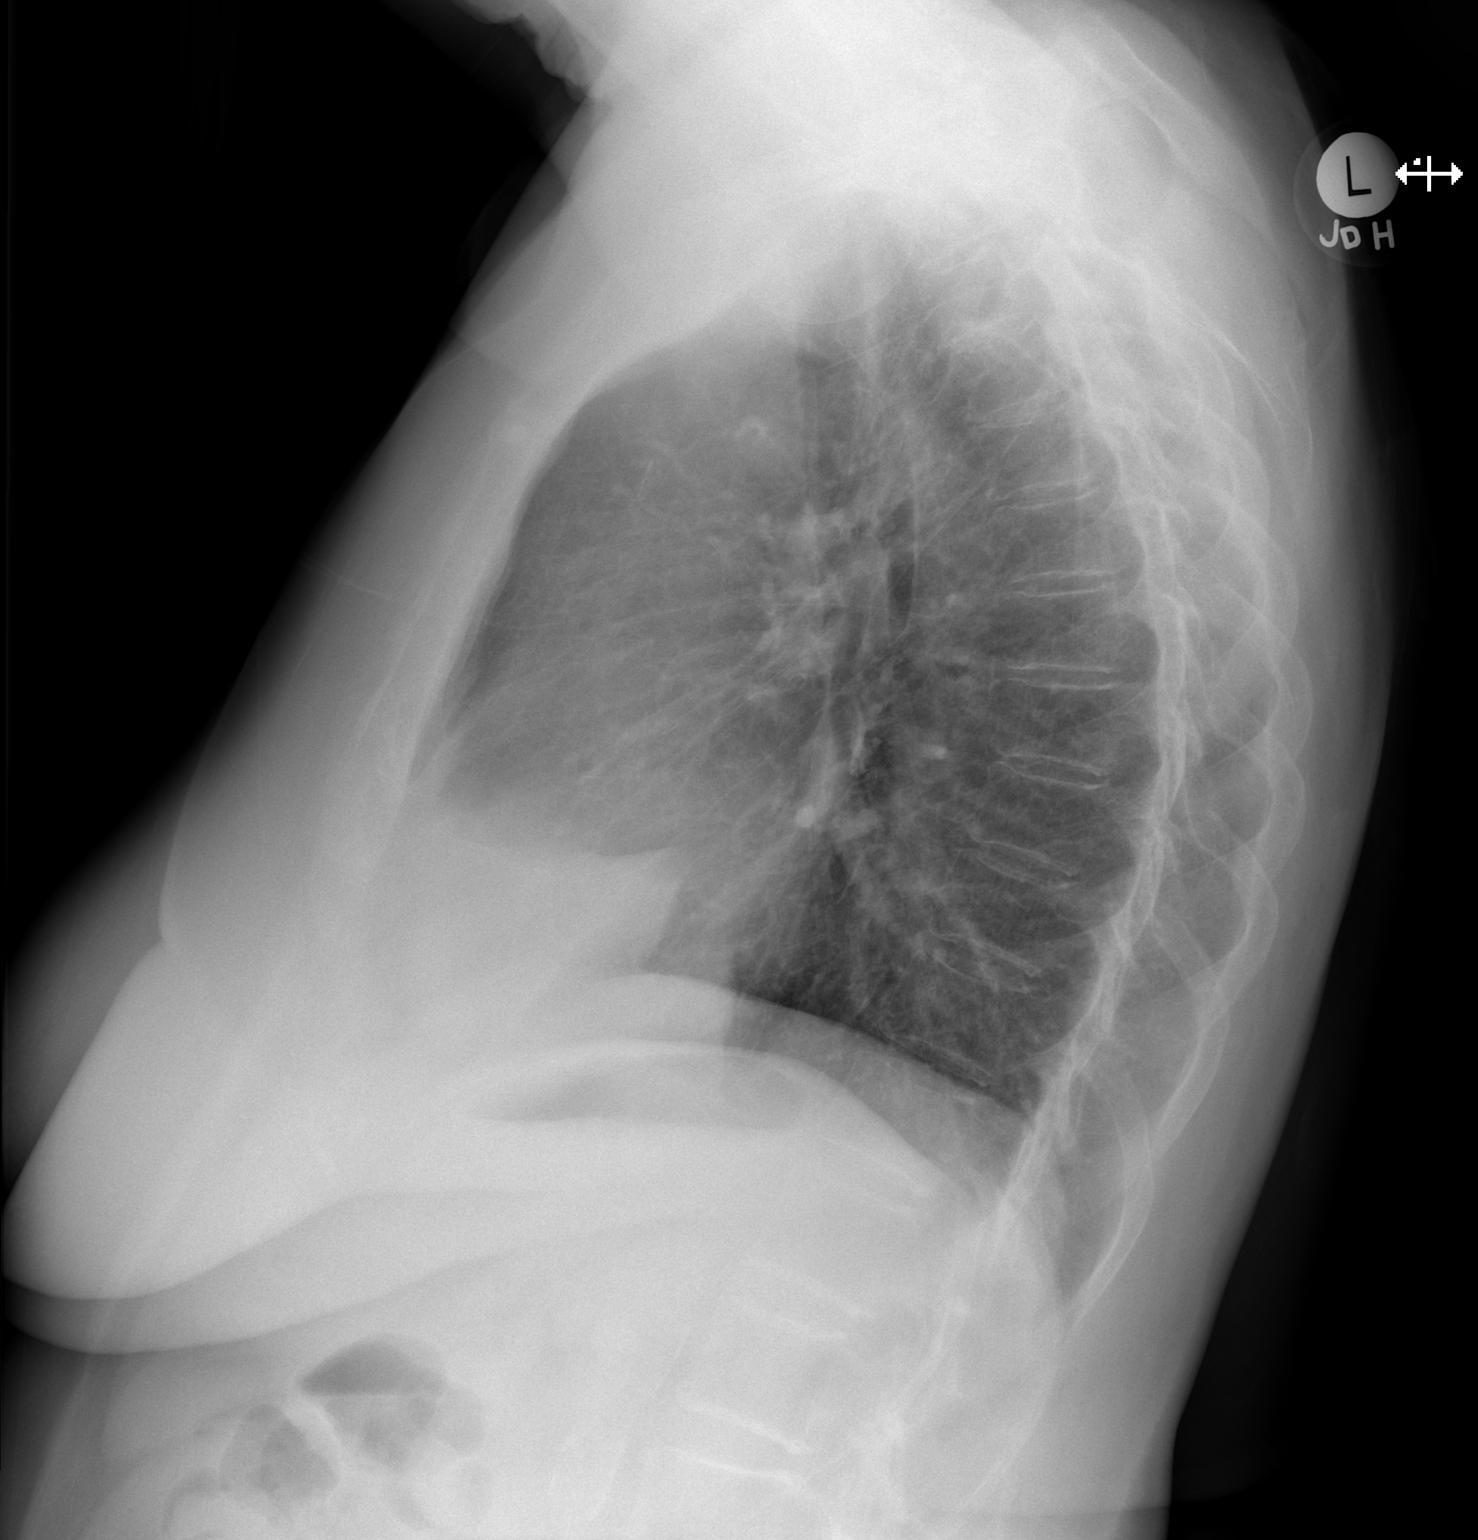

[2 of 2 positions shown; findings below may reference images not displayed]

FINDINGS: The heart size and mediastinal contours are within normal limits.
Both lungs are clear. The visualized skeletal structures are
unremarkable.
IMPRESSION: No active cardiopulmonary disease.
